# Patient Record
Sex: Male | Born: 1995 | Race: White | Hispanic: No | Marital: Single
Health system: Southern US, Community
[De-identification: ages and names within clinical notes are randomized; demographics above are authoritative.]

---

## 2016-08-02 ENCOUNTER — Emergency Department
Admission: EM | Admit: 2016-08-02 | Discharge: 2016-08-02 | Disposition: A | Discharge status: Home / Self Care | Payer: Medicaid Other | Attending: Emergency Medicine | Admitting: Emergency Medicine

## 2016-08-02 ENCOUNTER — Encounter: Payer: Self-pay | Admitting: Emergency Medicine

## 2016-08-02 ENCOUNTER — Emergency Department: Payer: Medicaid Other

## 2016-08-02 DIAGNOSIS — S20211A Contusion of right front wall of thorax, initial encounter: Secondary | ICD-10-CM | POA: Diagnosis not present

## 2016-08-02 DIAGNOSIS — Y92410 Unspecified street and highway as the place of occurrence of the external cause: Secondary | ICD-10-CM | POA: Insufficient documentation

## 2016-08-02 DIAGNOSIS — S299XXA Unspecified injury of thorax, initial encounter: Secondary | ICD-10-CM | POA: Diagnosis present

## 2016-08-02 DIAGNOSIS — Y939 Activity, unspecified: Secondary | ICD-10-CM | POA: Diagnosis not present

## 2016-08-02 DIAGNOSIS — Y999 Unspecified external cause status: Secondary | ICD-10-CM | POA: Diagnosis not present

## 2016-08-02 MED ORDER — IBUPROFEN 600 MG PO TABS
600.0000 mg | ORAL_TABLET | Freq: Once | ORAL | Status: AC
  Administered 2016-08-02: 600 mg via ORAL
  Filled 2016-08-02: qty 1

## 2016-08-02 MED ORDER — CYCLOBENZAPRINE HCL 10 MG PO TABS: 10.0000 mg | ORAL_TABLET | Freq: Three times a day (TID) | ORAL | 0 refills | Status: AC | PRN

## 2016-08-02 MED ORDER — TRAMADOL HCL 50 MG PO TABS
50.0000 mg | ORAL_TABLET | Freq: Once | ORAL | Status: AC
  Administered 2016-08-02: 50 mg via ORAL
  Filled 2016-08-02: qty 1

## 2016-08-02 MED ORDER — TRAMADOL HCL 50 MG PO TABS: 50.0000 mg | ORAL_TABLET | Freq: Four times a day (QID) | ORAL | 0 refills | Status: AC | PRN

## 2016-08-02 MED ORDER — CYCLOBENZAPRINE HCL 10 MG PO TABS
10.0000 mg | ORAL_TABLET | Freq: Once | ORAL | Status: AC
  Administered 2016-08-02: 10 mg via ORAL
  Filled 2016-08-02: qty 1

## 2016-08-02 MED ORDER — IBUPROFEN 600 MG PO TABS: 600.0000 mg | ORAL_TABLET | Freq: Three times a day (TID) | ORAL | 0 refills | Status: AC | PRN
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
